# Patient Record
Sex: Female | Born: 1975 | Race: Black or African American | Hispanic: No | Marital: Single | Smoking: Former smoker
Health system: Southern US, Community
[De-identification: ages and names within clinical notes are randomized; demographics above are authoritative.]

## PROBLEM LIST (undated history)

## (undated) DIAGNOSIS — J45909 Unspecified asthma, uncomplicated: Secondary | ICD-10-CM

## (undated) HISTORY — PX: SINUS ENDO W/FUSION: SHX777

---

## 2016-09-18 ENCOUNTER — Emergency Department: Payer: Medicaid Other

## 2016-09-18 ENCOUNTER — Inpatient Hospital Stay
Admission: EM | Admit: 2016-09-18 | Discharge: 2016-09-19 | DRG: 202 | Disposition: A | Discharge status: Home / Self Care | Payer: Medicaid Other | Attending: Internal Medicine | Admitting: Internal Medicine

## 2016-09-18 ENCOUNTER — Encounter: Payer: Self-pay | Admitting: Emergency Medicine

## 2016-09-18 DIAGNOSIS — J4542 Moderate persistent asthma with status asthmaticus: Secondary | ICD-10-CM | POA: Diagnosis present

## 2016-09-18 DIAGNOSIS — D72829 Elevated white blood cell count, unspecified: Secondary | ICD-10-CM | POA: Diagnosis present

## 2016-09-18 DIAGNOSIS — J45909 Unspecified asthma, uncomplicated: Secondary | ICD-10-CM | POA: Diagnosis present

## 2016-09-18 DIAGNOSIS — J4541 Moderate persistent asthma with (acute) exacerbation: Secondary | ICD-10-CM | POA: Diagnosis present

## 2016-09-18 DIAGNOSIS — T380X5A Adverse effect of glucocorticoids and synthetic analogues, initial encounter: Secondary | ICD-10-CM | POA: Diagnosis present

## 2016-09-18 DIAGNOSIS — E876 Hypokalemia: Secondary | ICD-10-CM | POA: Diagnosis present

## 2016-09-18 DIAGNOSIS — J209 Acute bronchitis, unspecified: Secondary | ICD-10-CM | POA: Diagnosis present

## 2016-09-18 DIAGNOSIS — J9601 Acute respiratory failure with hypoxia: Secondary | ICD-10-CM | POA: Diagnosis present

## 2016-09-18 DIAGNOSIS — F17210 Nicotine dependence, cigarettes, uncomplicated: Secondary | ICD-10-CM | POA: Diagnosis present

## 2016-09-18 DIAGNOSIS — R0602 Shortness of breath: Secondary | ICD-10-CM | POA: Diagnosis present

## 2016-09-18 HISTORY — DX: Unspecified asthma, uncomplicated: J45.909

## 2016-09-18 LAB — COMPREHENSIVE METABOLIC PANEL
ALBUMIN: 4 g/dL (ref 3.5–5.0)
ALK PHOS: 79 U/L (ref 38–126)
ALT: 21 U/L (ref 14–54)
AST: 26 U/L (ref 15–41)
Anion gap: 5 (ref 5–15)
BILIRUBIN TOTAL: 0.4 mg/dL (ref 0.3–1.2)
BUN: 12 mg/dL (ref 6–20)
CALCIUM: 8.9 mg/dL (ref 8.9–10.3)
CO2: 26 mmol/L (ref 22–32)
Chloride: 105 mmol/L (ref 101–111)
Creatinine, Ser: 1.07 mg/dL — ABNORMAL HIGH (ref 0.44–1.00)
GFR calc Af Amer: >60 mL/min (ref >60)
Glucose, Bld: 104 mg/dL — ABNORMAL HIGH (ref 65–99)
POTASSIUM: 3.3 mmol/L — AB (ref 3.5–5.1)
Sodium: 136 mmol/L (ref 135–145)
TOTAL PROTEIN: 7.4 g/dL (ref 6.5–8.1)

## 2016-09-18 LAB — CBC WITH DIFFERENTIAL/PLATELET
BASOS ABS: 0.1 10*3/uL (ref 0–0.1)
BASOS PCT: 1 %
Eosinophils Absolute: 0 10*3/uL (ref 0–0.7)
Eosinophils Relative: 0 %
HEMATOCRIT: 39.1 % (ref 35.0–47.0)
HEMOGLOBIN: 13.3 g/dL (ref 12.0–16.0)
LYMPHS PCT: 13 %
Lymphs Abs: 2.4 10*3/uL (ref 1.0–3.6)
MCH: 28.9 pg (ref 26.0–34.0)
MCHC: 34 g/dL (ref 32.0–36.0)
MCV: 85.1 fL (ref 80.0–100.0)
MONO ABS: 0.9 10*3/uL (ref 0.2–0.9)
Monocytes Relative: 5 %
NEUTROS ABS: 15.6 10*3/uL — AB (ref 1.4–6.5)
NEUTROS PCT: 81 %
Platelets: 237 10*3/uL (ref 150–440)
RBC: 4.59 MIL/uL (ref 3.80–5.20)
RDW: 13.2 % (ref 11.5–14.5)
WBC: 19 10*3/uL — ABNORMAL HIGH (ref 3.6–11.0)

## 2016-09-18 MED ORDER — HYDROCODONE-ACETAMINOPHEN 5-325 MG PO TABS: 1.0000 | ORAL_TABLET | ORAL | Status: DC | PRN

## 2016-09-18 MED ORDER — MAGNESIUM SULFATE 2 GM/50ML IV SOLN
2.0000 g | Freq: Once | INTRAVENOUS | Status: AC
  Administered 2016-09-18: 2 g via INTRAVENOUS
  Filled 2016-09-18: qty 50

## 2016-09-18 MED ORDER — ONDANSETRON HCL 4 MG/2ML IJ SOLN: 4.0000 mg | Freq: Four times a day (QID) | INTRAMUSCULAR | Status: DC | PRN

## 2016-09-18 MED ORDER — DEXTROSE 5 % IV SOLN
500.0000 mg | INTRAVENOUS | Status: DC
  Administered 2016-09-18: 500 mg via INTRAVENOUS
  Filled 2016-09-18 (×2): qty 500

## 2016-09-18 MED ORDER — ALPRAZOLAM 0.25 MG PO TABS: 0.2500 mg | ORAL_TABLET | Freq: Two times a day (BID) | ORAL | Status: DC | PRN

## 2016-09-18 MED ORDER — ONDANSETRON HCL 4 MG PO TABS: 4.0000 mg | ORAL_TABLET | Freq: Four times a day (QID) | ORAL | Status: DC | PRN

## 2016-09-18 MED ORDER — POTASSIUM CHLORIDE CRYS ER 20 MEQ PO TBCR
40.0000 meq | EXTENDED_RELEASE_TABLET | Freq: Once | ORAL | Status: AC
  Administered 2016-09-18: 40 meq via ORAL
  Filled 2016-09-18: qty 2

## 2016-09-18 MED ORDER — SODIUM CHLORIDE 0.9 % IV SOLN
INTRAVENOUS | Status: DC
  Administered 2016-09-18: 12:00:00 via INTRAVENOUS
  Administered 2016-09-18: 1000 mL via INTRAVENOUS
  Administered 2016-09-19: 05:00:00 via INTRAVENOUS

## 2016-09-18 MED ORDER — ACETAMINOPHEN 325 MG PO TABS: 650.0000 mg | ORAL_TABLET | Freq: Four times a day (QID) | ORAL | Status: DC | PRN

## 2016-09-18 MED ORDER — TIOTROPIUM BROMIDE MONOHYDRATE 18 MCG IN CAPS
18.0000 ug | ORAL_CAPSULE | Freq: Every day | RESPIRATORY_TRACT | Status: DC
  Administered 2016-09-18 – 2016-09-19 (×2): 18 ug via RESPIRATORY_TRACT
  Filled 2016-09-18: qty 5

## 2016-09-18 MED ORDER — ENOXAPARIN SODIUM 40 MG/0.4ML ~~LOC~~ SOLN
40.0000 mg | SUBCUTANEOUS | Status: DC
  Filled 2016-09-18: qty 0.4

## 2016-09-18 MED ORDER — METHYLPREDNISOLONE SODIUM SUCC 40 MG IJ SOLR
40.0000 mg | Freq: Three times a day (TID) | INTRAMUSCULAR | Status: DC
  Administered 2016-09-18 – 2016-09-19 (×4): 40 mg via INTRAVENOUS
  Filled 2016-09-18 (×4): qty 1

## 2016-09-18 MED ORDER — IPRATROPIUM-ALBUTEROL 0.5-2.5 (3) MG/3ML IN SOLN
3.0000 mL | RESPIRATORY_TRACT | Status: DC
  Administered 2016-09-18 – 2016-09-19 (×7): 3 mL via RESPIRATORY_TRACT
  Filled 2016-09-18 (×7): qty 3

## 2016-09-18 MED ORDER — ALBUTEROL SULFATE (2.5 MG/3ML) 0.083% IN NEBU
10.0000 mg/h | INHALATION_SOLUTION | RESPIRATORY_TRACT | Status: DC
  Administered 2016-09-18: 10 mg/h via RESPIRATORY_TRACT

## 2016-09-18 MED ORDER — ACETAMINOPHEN 650 MG RE SUPP: 650.0000 mg | Freq: Four times a day (QID) | RECTAL | Status: DC | PRN

## 2016-09-18 MED ORDER — SENNOSIDES-DOCUSATE SODIUM 8.6-50 MG PO TABS: 1.0000 | ORAL_TABLET | Freq: Every evening | ORAL | Status: DC | PRN

## 2016-09-18 MED ORDER — ALBUTEROL SULFATE (2.5 MG/3ML) 0.083% IN NEBU
INHALATION_SOLUTION | RESPIRATORY_TRACT | Status: AC
  Administered 2016-09-18: 10 mg/h via RESPIRATORY_TRACT
  Filled 2016-09-18: qty 12

## 2016-09-18 MED ORDER — NICOTINE 21 MG/24HR TD PT24
21.0000 mg | MEDICATED_PATCH | Freq: Every day | TRANSDERMAL | Status: DC
  Administered 2016-09-19: 21 mg via TRANSDERMAL
  Filled 2016-09-18: qty 1

## 2016-09-18 MED ORDER — SODIUM CHLORIDE 0.9% FLUSH
3.0000 mL | Freq: Two times a day (BID) | INTRAVENOUS | Status: DC
  Administered 2016-09-18: 3 mL via INTRAVENOUS

## 2016-09-18 NOTE — ED Notes (Signed)
This RN conferred with respiratory about administering continuous neb, per Respiratory place full dose and run at approx 6L/min O2.

## 2016-09-18 NOTE — H&P (Signed)
[employer] - [place] at [facility]   PATIENT NAME: Taylor Mcpherson    MR#:  [id number]  DATE OF BIRTH:  December 29, 1975  DATE OF ADMISSION:  09/18/2016  PRIMARY CARE PHYSICIAN: No primary care provider on file.   REQUESTING/REFERRING PHYSICIAN: Dr. Huel Cote  CHIEF COMPLAINT:   Shortness of breath HISTORY OF PRESENT ILLNESS:  Taylor Mcpherson  is a 40 y.o. female with a known history of Asthma and tobacco dependence who presents with above complaint. Patient reports over the past 3 days she's had increasing shortness of breath, cough and wheezing. In the emergency room she received DuoNeb's and steroids however has not had much improvement.  PAST MEDICAL HISTORY:   Past Medical History:  Diagnosis Date  . Asthma     PAST SURGICAL HISTORY:  None  SOCIAL HISTORY:   Social History  Substance Use Topics  . Smoking status: Current Some Day Smoker    Packs/day: 1.00    Types: Cigarettes  . Smokeless tobacco: Never Used  . Alcohol use No    FAMILY HISTORY:  No history of asthma  DRUG ALLERGIES:  None  REVIEW OF SYSTEMS:   Review of Systems  Constitutional: Negative for chills, fever and malaise/fatigue.  HENT: Negative.  Negative for ear discharge, ear pain, hearing loss, nosebleeds and sore throat.   Eyes: Negative.  Negative for blurred vision and pain.  Respiratory: Positive for cough, sputum production, shortness of breath and wheezing. Negative for hemoptysis.   Cardiovascular: Negative.  Negative for chest pain, palpitations and leg swelling.  Gastrointestinal: Negative.  Negative for abdominal pain, blood in stool, diarrhea, nausea and vomiting.  Genitourinary: Negative.  Negative for dysuria.  Musculoskeletal: Negative.  Negative for back pain.  Skin: Negative.   Neurological: Positive for weakness. Negative for dizziness, tremors, speech change, focal weakness, seizures and headaches.  Endo/Heme/Allergies: Negative.  Does not bruise/bleed easily.   Psychiatric/Behavioral: Negative.  Negative for depression, hallucinations and suicidal ideas.    MEDICATIONS AT HOME:   Prior to Admission medications   Not on File      VITAL SIGNS:  Blood pressure (!) 145/86, pulse (!) 101, temperature 98.1 F (36.7 C), temperature source Oral, resp. rate (!) 24, height 5\' 3"  (1.6 m), weight 93.9 kg (207 lb), last menstrual period 08/31/2016, SpO2 100 %.  PHYSICAL EXAMINATION:   Physical Exam  Constitutional: She is oriented to person, place, and time and well-developed, well-nourished, and in no distress. No distress.  HENT:  Head: Normocephalic.  Eyes: No scleral icterus.  Neck: Normal range of motion. Neck supple. No JVD present. No tracheal deviation present.  Cardiovascular: Normal rate, regular rhythm and normal heart sounds.  Exam reveals no gallop and no friction rub.   No murmur heard. Pulmonary/Chest: Effort normal. No respiratory distress. She has wheezes. She has no rales. She exhibits no tenderness.  Abdominal: Soft. Bowel sounds are normal. She exhibits no distension and no mass. There is no tenderness. There is no rebound and no guarding.  Musculoskeletal: Normal range of motion. She exhibits no edema.  Neurological: She is alert and oriented to person, place, and time.  Skin: Skin is warm. No rash noted. No erythema.  Psychiatric: Affect and judgment normal.      LABORATORY PANEL:   CBC  Recent Labs Lab 09/18/16 0834  WBC 19.0*  HGB 13.3  HCT 39.1  PLT 237   ------------------------------------------------------------------------------------------------------------------  Chemistries   Recent Labs Lab 09/18/16 0834  NA 136  K 3.3*  CL 105  CO2 26  GLUCOSE 104*  BUN 12  CREATININE 1.07*  CALCIUM 8.9  AST 26  ALT 21  ALKPHOS 79  BILITOT 0.4   ------------------------------------------------------------------------------------------------------------------  Cardiac Enzymes No results for input(s):  TROPONINI in the last 168 hours. ------------------------------------------------------------------------------------------------------------------  RADIOLOGY:  Dg Chest Port 1 View  Result Date: 09/18/2016 CLINICAL DATA:  Central chest pain for 1 day, some shortness of breath, smoking history EXAM: PORTABLE CHEST 1 VIEW COMPARISON:  None. FINDINGS: No active infiltrate or effusion is seen. Mediastinal and hilar contours are unremarkable. The heart is within normal limits in size. No bony abnormality is seen. IMPRESSION: No active disease. Electronically Signed   By: Dwyane DeePaul  Barry M.D.   On: 09/18/2016 08:50    EKG:  Sinus tachycardia no ST elevation or depression  IMPRESSION AND PLAN:   [age] year female with a history of asthma who presents with asthma exacerbation.  1. Acute hypoxic respiratory failure in the setting asthma exacerbation: Patient will be treated for asthma as outlined below. Wean oxygen as tolerated.  2. Acute asthma exacerbation: Patient will start on IV steroids, DuoNeb's and I will also initiate long acting beta agonist/steroid inhaler. She will need a rescue inhaler at discharge along with an long acting beta agonist/steroid combination  3. Tobacco dependence: Patient is highly encouraged stop smoking. Patient counseled 3 minutes. Patient does not want a nicotine patch at this time. I did mention if she needs it during hospitalization then it will be available.  4. Leukocytosis: No evidence of pneumonia on chest x-ray. Will repeat CBC in a.m.  5. Hypokalemia: This was repleted. She was given magnesium in the emergency room for acute asthma exacerbation.   All the records are reviewed and case discussed with ED provider. Management plans discussed with the patient and she in agreement  CODE STATUS: full  TOTAL TIME TAKING CARE OF THIS PATIENT: 50 minutes.    Brinlynn Gorton M.D on 09/18/2016 at 11:46 AM  Between 7am to 6pm - Pager - [phone]  After 6pm go to  [email or web address] - Social research officer, governmentpassword [email or web address]  [facility] [place] [facility]  Office  [phone]  CC: Primary care physician; No primary care provider on file.

## 2016-09-18 NOTE — ED Notes (Signed)
Report called to Josh, RN 

## 2016-09-18 NOTE — ED Notes (Signed)
Pt continues her continuous nebulizer at this time. Pt noted to be sleeping but is arousable with mild stimulation at this time. No change in patient condition.

## 2016-09-18 NOTE — ED Triage Notes (Signed)
Pt presents from home via [facility] with c/o difficulty breathing and chest pain. Pt states chest pain started yesterday, c/o L sided chest pain that is sharp/shooting in nature. Pt also c/o SHOB, states hx of asthma and has been taking rescue inhaler at home with no relief. Per EMS 125mg  of Solumedrol given en route as well as 2 duonebs. EMS states pt has been coughing up yellow sputum.

## 2016-09-18 NOTE — ED Notes (Signed)
MD notified, this RN to bedside, pt states that she is coughing up blood, specs of blood noted to be in patient sputum.

## 2016-09-18 NOTE — ED Notes (Signed)
Pt placed on 3L O2 via [place] at this time due to O2 sat 82% on room air. MD notified at this time, MD to bedside at this time.

## 2016-09-18 NOTE — ED Provider Notes (Signed)
Time Seen: Approximately 0834 I have reviewed the triage notes  Chief Complaint: Chest Pain and Shortness of Breath   History of Present Illness: Taylor Mcpherson is a 40 y.o. female who presents with a history of asthma. Patient states that she's had a productive cough and increasing shortness of breath over the last 2-3 days. She denies any hemoptysis. She denies any significant pulmonary emboli risk factors. Patient states she does have a history of asthma as been admitted before in the past approximately year ago at [facility]. The patient states she's had some chills no obvious fever at home. Patient was transported here by EMS after she used one nebulizer at home without relief and received 2 in transport. She's had IV Solu-Medrol 125 mg IV.   No past medical history on file.  There are no active problems to display for this patient.   No past surgical history on file.  No past surgical history on file.    Allergies:  Review of patient's allergies indicates not on file.  Family History: No family history on file.  Social History: Social History  Substance Use Topics  . Smoking status: Not on file  . Smokeless tobacco: Not on file  . Alcohol use Not on file     Review of Systems:   10 point review of systems was performed and was otherwise negative:  Constitutional: No Objective fever Eyes: No visual disturbances ENT: No sore throat, ear pain Cardiac: No chest pain Respiratory: Increasing shortness of breath. Abdomen: No abdominal pain, no vomiting, No diarrhea Endocrine: No weight loss, No night sweats Extremities: No peripheral edema, cyanosis Skin: No rashes, easy bruising Neurologic: No focal weakness, trouble with speech or swollowing Urologic: No dysuria, Hematuria, or urinary frequency *  Physical Exam:  ED Triage Vitals [09/18/16 0833]  Enc Vitals Group     BP (!) 158/95     Pulse Rate (!) 103     Resp (!) 24     Temp 98.1 F (36.7  C)     Temp Source Axillary     SpO2 (!) 82 %     Weight 207 lb (93.9 kg)     Height 5\' 3"  (1.6 m)     Head Circumference      Peak Flow      Pain Score 9     Pain Loc      Pain Edu?      Excl. in GC?     General: Awake , Alert , and Oriented times 3; GCS 15 Patient has some mild upper respiratory retractions and speaks in interrupted sentences Head: Normal cephalic , atraumatic Eyes: Pupils equal , round, reactive to light Nose/Throat: No nasal drainage, patent upper airway without erythema or exudate.  Neck: Supple, Full range of motion, No anterior adenopathy or palpable thyroid masses Lungs: Diffuse wheezing heard bilaterallysymmetric except some mild rhonchi in the left lower lung fields. Heart: Tachycardic, regular rhythm without murmurs , gallops , or rubs Abdomen: Soft, non tender without rebound, guarding , or rigidity; bowel sounds positive and symmetric in all 4 quadrants. No organomegaly .        Extremities: 2 plus symmetric pulses. No edema, clubbing or cyanosis. Negative Homans sign Neurologic: normal ambulation, Motor symmetric without deficits, sensory intact Skin: warm, dry, no rashes   Labs:   All laboratory work was reviewed including any pertinent negatives or positives listed below:  Labs Reviewed  CBC WITH DIFFERENTIAL/PLATELET  COMPREHENSIVE METABOLIC PANEL  EKG:  ED ECG REPORT I, Jennye MoccasinBrian S Jeorgia Helming, the attending physician, personally viewed and interpreted this ECG.  Date: 09/18/2016 EKG Time: 0825 Rate: 104 Rhythm: Sinus tachycardia QRS Axis: normal Intervals: normal ST/T Wave abnormalities: normal Conduction Disturbances: none Narrative Interpretation: unremarkable no acute ischemic changes  Radiology: * "Dg Chest Port 1 View  Result Date: 09/18/2016 CLINICAL DATA:  Central chest pain for 1 day, some shortness of breath, smoking history EXAM: PORTABLE CHEST 1 VIEW COMPARISON:  None. FINDINGS: No active infiltrate or effusion is seen.  Mediastinal and hilar contours are unremarkable. The heart is within normal limits in size. No bony abnormality is seen. IMPRESSION: No active disease. Electronically Signed   By: Dwyane DeePaul  Barry M.D.   On: 09/18/2016 08:50  " I personally reviewed the radiologic studies    ED Course:  Patient's stay here showed some mild symptomatic improvement though she arrives significantly hypoxic with a history of previous admissions for her asthma. Patient was given IV Solu-Medrol prior to arrival and was placed on a continuous nebulization here in emergency department. She has no history of cardiovascular disease. On a 6 L nasal cannula and were able to decrease it somewhat while she was receiving her treatment etc.  Clinical Course     Assessment: Acute status asthmaticus    Plan:  Inpatient            Jennye MoccasinBrian S Izmael Duross, MD 09/18/16 [phone]

## 2016-09-19 LAB — CBC
HEMATOCRIT: 40.3 % (ref 35.0–47.0)
Hemoglobin: 13 g/dL (ref 12.0–16.0)
MCH: 27.9 pg (ref 26.0–34.0)
MCHC: 32.2 g/dL (ref 32.0–36.0)
MCV: 86.5 fL (ref 80.0–100.0)
Platelets: 248 10*3/uL (ref 150–440)
RBC: 4.66 MIL/uL (ref 3.80–5.20)
RDW: 13.5 % (ref 11.5–14.5)
WBC: 24.3 10*3/uL — ABNORMAL HIGH (ref 3.6–11.0)

## 2016-09-19 LAB — BASIC METABOLIC PANEL
ANION GAP: 7 (ref 5–15)
BUN: 11 mg/dL (ref 6–20)
CHLORIDE: 109 mmol/L (ref 101–111)
CO2: 23 mmol/L (ref 22–32)
Calcium: 8.9 mg/dL (ref 8.9–10.3)
Creatinine, Ser: 0.93 mg/dL (ref 0.44–1.00)
Glucose, Bld: 150 mg/dL — ABNORMAL HIGH (ref 65–99)
POTASSIUM: 4 mmol/L (ref 3.5–5.1)
SODIUM: 139 mmol/L (ref 135–145)

## 2016-09-19 MED ORDER — TIOTROPIUM BROMIDE MONOHYDRATE 18 MCG IN CAPS: 18.0000 ug | ORAL_CAPSULE | Freq: Every day | RESPIRATORY_TRACT | 12 refills | Status: DC

## 2016-09-19 MED ORDER — PREDNISONE 10 MG PO TABS: 10.0000 mg | ORAL_TABLET | Freq: Every day | ORAL | 0 refills | Status: DC

## 2016-09-19 MED ORDER — AZITHROMYCIN 250 MG PO TABS: 250.0000 mg | ORAL_TABLET | Freq: Every day | ORAL | 0 refills | Status: DC

## 2016-09-19 MED ORDER — ALBUTEROL SULFATE HFA 108 (90 BASE) MCG/ACT IN AERS: 2.0000 | INHALATION_SPRAY | Freq: Four times a day (QID) | RESPIRATORY_TRACT | 2 refills | Status: DC | PRN

## 2016-09-19 MED ORDER — NICOTINE 21 MG/24HR TD PT24: 21.0000 mg | MEDICATED_PATCH | Freq: Every day | TRANSDERMAL | 0 refills | Status: DC

## 2016-09-19 MED ORDER — NICOTINE 21 MG/24HR TD PT24: 21.0000 mg | MEDICATED_PATCH | Freq: Every day | TRANSDERMAL | 0 refills | Status: AC

## 2016-09-19 MED ORDER — TIOTROPIUM BROMIDE MONOHYDRATE 18 MCG IN CAPS: 18.0000 ug | ORAL_CAPSULE | Freq: Every day | RESPIRATORY_TRACT | 12 refills | Status: AC

## 2016-09-19 NOTE — Progress Notes (Signed)
MD ordered patient to be discharged home.  Discharge instructions were reviewed with the patient and she voiced understanding.  Follow-up appointment was made.  Prescriptions given to the patient.  Patient given her Spiriva inhaler to take home.  IV was removed with catheter intact.  All patients questions were answered.  Patient leaving via wheelchair escorted by auxillary.

## 2016-09-19 NOTE — Discharge Summary (Signed)
[employer] - [place] at [facility]   PATIENT NAME: Taylor Mcpherson    MR#:  [id number]  DATE OF BIRTH:  03-Oct-1976  DATE OF ADMISSION:  09/18/2016 ADMITTING PHYSICIAN: Adrian Saran, MD  DATE OF DISCHARGE: 09/19/2016   PRIMARY CARE PHYSICIAN: No primary care provider on file.    ADMISSION DIAGNOSIS:  Moderate persistent asthma with exacerbation [J45.41]  DISCHARGE DIAGNOSIS:  Active Problems:   Asthma   SECONDARY DIAGNOSIS:   Past Medical History:  Diagnosis Date  . Asthma     HOSPITAL COURSE:   40 year female with a history of asthma who presents with asthma exacerbation.  1. Acute hypoxic respiratory failure in the setting asthma exacerbation: Patient was treated for asthma as outlined below. She was weaned off of OXYGEN.  2. Acute asthma exacerbation: Patient Was initiated on IV steroids and azithromycin. She responded well to IV steroids. She will be transitioned to oral steroids at discharge and will continue azithromycin for acute bronchitis..   3. Tobacco dependence: She will be discharged with a nicotine patch. She is encouraged to smoking.   4. Leukocytosis:White blood cell elevation today is due to steroids. She had no fevers and no evidence of pneumonia.  5. Hypokalemia: This was repleted.   DISCHARGE CONDITIONS AND DIET:   Stable Regular diet  CONSULTS OBTAINED:    DRUG ALLERGIES:  Not on File  DISCHARGE MEDICATIONS:   Current Discharge Medication List    START taking these medications   Details  albuterol (PROVENTIL HFA;VENTOLIN HFA) 108 (90 Base) MCG/ACT inhaler Inhale 2 puffs into the lungs every 6 (six) hours as needed for wheezing or shortness of breath. Qty: 1 Inhaler, Refills: 2    azithromycin (ZITHROMAX) 250 MG tablet Take 1 tablet (250 mg total) by mouth daily. Qty: 6 each, Refills: 0    nicotine (NICODERM CQ - DOSED IN MG/24 HOURS) 21 mg/24hr patch Place 1 patch (21 mg total) onto the skin daily. Qty: 28 patch,  Refills: 0    predniSONE (DELTASONE) 10 MG tablet Take 1 tablet (10 mg total) by mouth daily with breakfast. 60 mg PO (ORAL) x 2 days 50 mg PO (ORAL)  x 2 days 40 mg PO (ORAL)  x 2 days 30 mg PO  (ORAL)  x 2 days 20 mg PO  (ORAL) x 2 days 10 mg PO  (ORAL) x 2 days then stop Qty: 42 tablet, Refills: 0    tiotropium (SPIRIVA) 18 MCG inhalation capsule Place 1 capsule (18 mcg total) into inhaler and inhale daily. Qty: 30 capsule, Refills: 12              Today   CHIEF COMPLAINT:  Patient is doing well wants to go home less SOB and whezing today    VITAL SIGNS:  Blood pressure 139/67, pulse 95, temperature 98.5 F (36.9 C), temperature source Oral, resp. rate 20, height 5\' 3"  (1.6 m), weight 93.9 kg (207 lb), last menstrual period 08/31/2016, SpO2 93 %.   REVIEW OF SYSTEMS:  Review of Systems  Constitutional: Negative.  Negative for chills, fever and malaise/fatigue.  HENT: Negative.  Negative for ear discharge, ear pain, hearing loss, nosebleeds and sore throat.   Eyes: Negative.  Negative for blurred vision and pain.  Respiratory: Positive for cough. Negative for hemoptysis, shortness of breath and wheezing.   Cardiovascular: Negative.  Negative for chest pain, palpitations and leg swelling.  Gastrointestinal: Negative.  Negative for abdominal pain, blood in stool, diarrhea, nausea and vomiting.  Genitourinary: Negative.  Negative for dysuria.  Musculoskeletal: Negative.  Negative for back pain.  Skin: Negative.   Neurological: Negative for dizziness, tremors, speech change, focal weakness, seizures and headaches.  Endo/Heme/Allergies: Negative.  Does not bruise/bleed easily.  Psychiatric/Behavioral: Negative.  Negative for depression, hallucinations and suicidal ideas.     PHYSICAL EXAMINATION:  GENERAL:  40 y.o.-year-old patient lying in the bed with no acute distress.  NECK:  Supple, no jugular venous distention. No thyroid enlargement, no tenderness.  LUNGS:  Normal breath sounds bilaterally, + wheezing, NO rales,rhonchi  No use of accessory muscles of respiration.  CARDIOVASCULAR: S1, S2 normal. No murmurs, rubs, or gallops.  ABDOMEN: Soft, non-tender, non-distended. Bowel sounds present. No organomegaly or mass.  EXTREMITIES: No pedal edema, cyanosis, or clubbing.  PSYCHIATRIC: The patient is alert and oriented x 3.  SKIN: No obvious rash, lesion, or ulcer.   DATA REVIEW:   CBC  Recent Labs Lab 09/19/16 0546  WBC 24.3*  HGB 13.0  HCT 40.3  PLT 248    Chemistries   Recent Labs Lab 09/18/16 0834 09/19/16 0546  NA 136 139  K 3.3* 4.0  CL 105 109  CO2 26 23  GLUCOSE 104* 150*  BUN 12 11  CREATININE 1.07* 0.93  CALCIUM 8.9 8.9  AST 26  --   ALT 21  --   ALKPHOS 79  --   BILITOT 0.4  --     Cardiac Enzymes No results for input(s): TROPONINI in the last 168 hours.  Microbiology Results  @MICRORSLT48 @  RADIOLOGY:  Dg Chest Port 1 View  Result Date: 09/18/2016 CLINICAL DATA:  Central chest pain for 1 day, some shortness of breath, smoking history EXAM: PORTABLE CHEST 1 VIEW COMPARISON:  None. FINDINGS: No active infiltrate or effusion is seen. Mediastinal and hilar contours are unremarkable. The heart is within normal limits in size. No bony abnormality is seen. IMPRESSION: No active disease. Electronically Signed   By: Dwyane DeePaul  Barry M.D.   On: 09/18/2016 08:50      Management plans discussed with the patient and she is in agreement. Stable for discharge home  Patient should follow up with pcp  CODE STATUS:     Code Status Orders        Start     Ordered   09/18/16 1054  Full code  Continuous     09/18/16 1053    Code Status History    Date Active Date Inactive Code Status Order ID Comments User Context   This patient has a current code status but no historical code status.      TOTAL TIME TAKING CARE OF THIS PATIENT: 35 minutes.    Note: This dictation was prepared with Dragon dictation along with  smaller phrase technology. Any transcriptional errors that result from this process are unintentional.  Anisa Leanos M.D on 09/19/2016 at 11:39 AM  Between 7am to 6pm - Pager - [phone] After 6pm go to [email or web address] - Social research officer, governmentpassword EPAS [email or web address]  [facility] [place] [facility]  Office  [phone]  CC: Primary care physician; No primary care provider on file.

## 2016-09-19 NOTE — Care Management (Signed)
Patient listed as self pay patient.  Upon assessment patient states that she has active [place] medicaid, and uses it to obtain her medications.  Patient states that her medications are $3, and she denies issues obtaining those.  Should the patient run issues to obtaining her medication, and in the event that her Medicaid were to be deemed inactive I have provided her an application to medication management.  Per Medication management they would be able to fill her new prescriptions except for the nicotine patch and sprivia.  RNCM signing off.

## 2016-09-19 NOTE — Progress Notes (Signed)
Patient was taken off her oxygen this morning and her sats at rest were 92%.  She then got up and ambulated 1 lap around the nurses station and her sats stayed around 93%.  Patient was a little short of breath but did well and stated "she was not nearly as short of breath as she was when she came in."

## 2017-01-03 ENCOUNTER — Emergency Department: Payer: Medicaid Other

## 2017-01-03 ENCOUNTER — Emergency Department
Admission: EM | Admit: 2017-01-03 | Discharge: 2017-01-03 | Disposition: A | Discharge status: Home / Self Care | Payer: Medicaid Other | Attending: Emergency Medicine | Admitting: Emergency Medicine

## 2017-01-03 ENCOUNTER — Encounter: Payer: Self-pay | Admitting: Emergency Medicine

## 2017-01-03 DIAGNOSIS — R0602 Shortness of breath: Secondary | ICD-10-CM | POA: Diagnosis present

## 2017-01-03 DIAGNOSIS — Z87891 Personal history of nicotine dependence: Secondary | ICD-10-CM | POA: Insufficient documentation

## 2017-01-03 DIAGNOSIS — J45901 Unspecified asthma with (acute) exacerbation: Secondary | ICD-10-CM | POA: Insufficient documentation

## 2017-01-03 DIAGNOSIS — J101 Influenza due to other identified influenza virus with other respiratory manifestations: Secondary | ICD-10-CM

## 2017-01-03 DIAGNOSIS — J09X2 Influenza due to identified novel influenza A virus with other respiratory manifestations: Secondary | ICD-10-CM | POA: Diagnosis not present

## 2017-01-03 LAB — INFLUENZA PANEL BY PCR (TYPE A & B)
Influenza A By PCR: POSITIVE — AB
Influenza B By PCR: NEGATIVE

## 2017-01-03 MED ORDER — ALBUTEROL SULFATE (2.5 MG/3ML) 0.083% IN NEBU
5.0000 mg | INHALATION_SOLUTION | Freq: Once | RESPIRATORY_TRACT | Status: AC
  Administered 2017-01-03: 5 mg via RESPIRATORY_TRACT
  Filled 2017-01-03: qty 6

## 2017-01-03 MED ORDER — PREDNISONE 20 MG PO TABS
60.0000 mg | ORAL_TABLET | Freq: Once | ORAL | Status: AC
  Administered 2017-01-03: 60 mg via ORAL
  Filled 2017-01-03: qty 3

## 2017-01-03 MED ORDER — IPRATROPIUM-ALBUTEROL 0.5-2.5 (3) MG/3ML IN SOLN
3.0000 mL | Freq: Once | RESPIRATORY_TRACT | Status: AC
  Administered 2017-01-03: 3 mL via RESPIRATORY_TRACT
  Filled 2017-01-03: qty 3

## 2017-01-03 MED ORDER — ALBUTEROL SULFATE HFA 108 (90 BASE) MCG/ACT IN AERS: 2.0000 | INHALATION_SPRAY | Freq: Four times a day (QID) | RESPIRATORY_TRACT | 2 refills | Status: AC | PRN

## 2017-01-03 MED ORDER — PREDNISONE 20 MG PO TABS: 60.0000 mg | ORAL_TABLET | Freq: Every day | ORAL | 0 refills | Status: AC

## 2017-01-03 MED ORDER — OSELTAMIVIR PHOSPHATE 75 MG PO CAPS
75.0000 mg | ORAL_CAPSULE | Freq: Once | ORAL | Status: AC
  Administered 2017-01-03: 75 mg via ORAL
  Filled 2017-01-03: qty 1

## 2017-01-03 MED ORDER — OSELTAMIVIR PHOSPHATE 75 MG PO CAPS: 75.0000 mg | ORAL_CAPSULE | Freq: Two times a day (BID) | ORAL | 0 refills | Status: AC

## 2017-01-03 NOTE — ED Notes (Signed)
This RN to bedside at this time. Pt resting in bed with NAD noted at this time. Pt awoke to adminster meds to her granddaughter who is also a patient at this time. Upon this RN departure from room, lights dimmed for patient comfort.

## 2017-01-03 NOTE — ED Notes (Signed)
NAD noted at time of D/C. Pt denies questions or concerns. Pt ambulatory to the lobby at this time.  

## 2017-01-03 NOTE — ED Notes (Signed)
This RN to bedside, brought patient sprite per request. This RN instructed patient that she could have a sprite after she finished her breathing tx. Pt states understanding, visualized in NAD at this time.

## 2017-01-03 NOTE — ED Triage Notes (Signed)
Pt states [facility] and given Prednisone and Amoxicillin d/t a sinus infection. Pt states that her asthma is still bothering her at this time.  Pt is ambulatory to triage with NAD noted at this time.

## 2017-01-03 NOTE — ED Provider Notes (Signed)
[facility] Emergency Department Provider Note  ____________________________________________  Time seen: Approximately 8:14 AM  I have reviewed the triage vital signs and the nursing notes.   HISTORY  Chief Complaint Wheezing   HPI Quetzalli Clos is a 41 y.o. female history of asthma who presents for evaluation of wheezing and shortness of breath. Patient reports that she has been sick on and off for the last month. Was seen at [facility] 4 days ago and was started on prednisone and amoxicillin. She finished her prednisone yesterday and is still on amoxicillin. She was diagnosed and asthma exacerbation. Patient continues to use her inhalers at home however reports that her cough is very severe and is keeping her up at night. No fever or chills, no nausea or diarrhea. Patient has had a few episodes of posttussive emesis. She reports that she stop smoking 2 months ago. She has not receive her flu shot this year. Patient denies chest pain, abdominal pain. She has had SOb and wheezing. Cough productive of green sputum.  Past Medical History:  Diagnosis Date  . Asthma     Patient Active Problem List   Diagnosis Date Noted  . Asthma 09/18/2016    Past Surgical History:  Procedure Laterality Date  . CESAREAN SECTION    . SINUS ENDO W/FUSION      Prior to Admission medications   Medication Sig Start Date End Date Taking? Authorizing Provider  amoxicillin-clavulanate (AUGMENTIN) 875-125 MG tablet Take 1 tablet by mouth 2 (two) times daily. 12/25/16 01/03/17 Yes Historical Provider, MD  albuterol (PROVENTIL HFA;VENTOLIN HFA) 108 (90 Base) MCG/ACT inhaler Inhale 2 puffs into the lungs every 6 (six) hours as needed for wheezing or shortness of breath. 01/03/17   Nita Sickle, MD  nicotine (NICODERM CQ - DOSED IN MG/24 HOURS) 21 mg/24hr patch Place 1 patch (21 mg total) onto the skin daily. Patient not taking: Reported on 01/03/2017 09/19/16   Adrian Saran, MD    oseltamivir (TAMIFLU) 75 MG capsule Take 1 capsule (75 mg total) by mouth 2 (two) times daily. 01/03/17 01/08/17  Nita Sickle, MD  predniSONE (DELTASONE) 20 MG tablet Take 3 tablets (60 mg total) by mouth daily. 01/03/17 01/07/17  Nita Sickle, MD  tiotropium (SPIRIVA) 18 MCG inhalation capsule Place 1 capsule (18 mcg total) into inhaler and inhale daily. Patient not taking: Reported on 01/03/2017 09/19/16   Adrian Saran, MD    Allergies Vicodin [hydrocodone-acetaminophen]  No family history on file.  Social History Social History  Substance Use Topics  . Smoking status: Former Smoker    Packs/day: 1.00    Types: Cigarettes    Quit date: 07/03/2016  . Smokeless tobacco: Never Used  . Alcohol use No    Review of Systems  Constitutional: Negative for fever. Eyes: Negative for visual changes. ENT: Negative for sore throat. Neck: No neck pain  Cardiovascular: Negative for chest pain. Respiratory: + shortness of breath, cough, wheezing Gastrointestinal: Negative for abdominal pain, vomiting or diarrhea. Genitourinary: Negative for dysuria. Musculoskeletal: Negative for back pain. Skin: Negative for rash. Neurological: Negative for headaches, weakness or numbness. Psych: No SI or HI  ____________________________________________   PHYSICAL EXAM:  VITAL SIGNS: ED Triage Vitals  Enc Vitals Group     BP 01/03/17 0618 125/88     Pulse Rate 01/03/17 0618 72     Resp --      Temp 01/03/17 0618 98.1 F (36.7 C)     Temp Source 01/03/17 0618 Oral  SpO2 01/03/17 0618 97 %     Weight 01/03/17 0603 235 lb (106.6 kg)     Height 01/03/17 0603 5\' 3"  (1.6 m)     Head Circumference --      Peak Flow --      Pain Score 01/03/17 0605 7     Pain Loc --      Pain Edu? --      Excl. in GC? --     Constitutional: Alert and oriented. Well appearing and in no apparent distress. HEENT:      Head: Normocephalic and atraumatic.         Eyes: Conjunctivae are normal. Sclera is  non-icteric. EOMI. PERRL      Mouth/Throat: Mucous membranes are moist.       Neck: Supple with no signs of meningismus. Cardiovascular: Regular rate and rhythm. No murmurs, gallops, or rubs. 2+ symmetrical distal pulses are present in all extremities. No JVD. Respiratory: Normal respiratory effort. Lungs are clear to auscultation bilaterally with diffuse expiratory wheezes, no crackles. Gastrointestinal: Soft, non tender, and non distended with positive bowel sounds. No rebound or guarding. Genitourinary: No CVA tenderness. Musculoskeletal: Nontender with normal range of motion in all extremities. No edema, cyanosis, or erythema of extremities. Neurologic: Normal speech and language. Face is symmetric. Moving all extremities. No gross focal neurologic deficits are appreciated. Skin: Skin is warm, dry and intact. No rash noted. Psychiatric: Mood and affect are normal. Speech and behavior are normal.  ____________________________________________   LABS (all labs ordered are listed, but only abnormal results are displayed)  Labs Reviewed  INFLUENZA PANEL BY PCR (TYPE A & B) - Abnormal; Notable for the following:       Result Value   Influenza A By PCR POSITIVE (*)    All other components within normal limits   ____________________________________________  EKG  ED ECG REPORT I, Nita Sicklearolina Karne Ozga, the attending physician, personally viewed and interpreted this ECG.  Normal sinus rhythm, rate of 68, normal intervals, normal axis, no ST elevations or depressions ____________________________________________  RADIOLOGY  CXR:  Mild central bronchial thickening may be bronchitis or asthma. ____________________________________________   PROCEDURES  Procedure(s) performed: None Procedures Critical Care performed: yes  CRITICAL CARE Performed by: Nita Sicklearolina Tinisha Etzkorn  ?  Total critical care time: 35 min  Critical care time was exclusive of separately billable procedures and  treating other patients.  Critical care was necessary to treat or prevent imminent or life-threatening deterioration.  Critical care was time spent personally by me on the following activities: development of treatment plan with patient and/or surrogate as well as nursing, discussions with consultants, evaluation of patient's response to treatment, examination of patient, obtaining history from patient or surrogate, ordering and performing treatments and interventions, ordering and review of laboratory studies, ordering and review of radiographic studies, pulse oximetry and re-evaluation of patient's condition.  ____________________________________________   INITIAL IMPRESSION / ASSESSMENT AND PLAN / ED COURSE  41 y.o. female history of asthma who presents for evaluation of wheezing and shortness of breath in the setting of productive cough. This has just finished a course of prednisone and is currently on amoxicillin was started 4 days ago. She is well appearing and in no distress, her vital signs are within normal limits, she has normal work of breathing with diffuse expiratory wheezes, no crackles. Chest x-ray with no evidence of pneumonia. We'll check for flu. We'll give duoneb and prednisone.     _________________________ 12:56 PM on 01/03/2017 -----------------------------------------  Patient received  3 duoneb treatments and was still wheezing. She then received 5 mg of albuterol and is currently moving great air with no wheezing. She is Flu A positive and was started on Tamiflu. Chest x-ray with no evidence of pneumonia. Patient be discharged home on Tamiflu, prednisone 60 mg 5 days, and albuterol. Recommended follow-up with [facility] care doctor on Monday area discussed return precautions with patient.  Pertinent labs & imaging results that were available during my care of the patient were reviewed by me and considered in my medical decision making (see chart for  details).    ____________________________________________   FINAL CLINICAL IMPRESSION(S) / ED DIAGNOSES  Final diagnoses:  Influenza A  Exacerbation of asthma, unspecified asthma severity, unspecified whether persistent      NEW MEDICATIONS STARTED DURING THIS VISIT:  New Prescriptions   ALBUTEROL (PROVENTIL HFA;VENTOLIN HFA) 108 (90 BASE) MCG/ACT INHALER    Inhale 2 puffs into the lungs every 6 (six) hours as needed for wheezing or shortness of breath.   OSELTAMIVIR (TAMIFLU) 75 MG CAPSULE    Take 1 capsule (75 mg total) by mouth 2 (two) times daily.   PREDNISONE (DELTASONE) 20 MG TABLET    Take 3 tablets (60 mg total) by mouth daily.     Note:  This document was prepared using Dragon voice recognition software and may include unintentional dictation errors.    Nita Sickle, MD 01/03/17 1257

## 2017-01-03 NOTE — ED Notes (Signed)
Pt awakened by this RN to check vitals. Pt is noted to be alert and oriented at this time. Pt denies any needs. Will continue to monitor for further patient needs.

## 2018-05-10 IMAGING — DX DG CHEST 1V PORT
1 series · 1 of 1 positions shown · non-contrast
Comparison: None.

CLINICAL DATA: Central chest pain for 1 day, some shortness of
breath, smoking history

EXAM:
PORTABLE CHEST 1 VIEW

[chest ap]
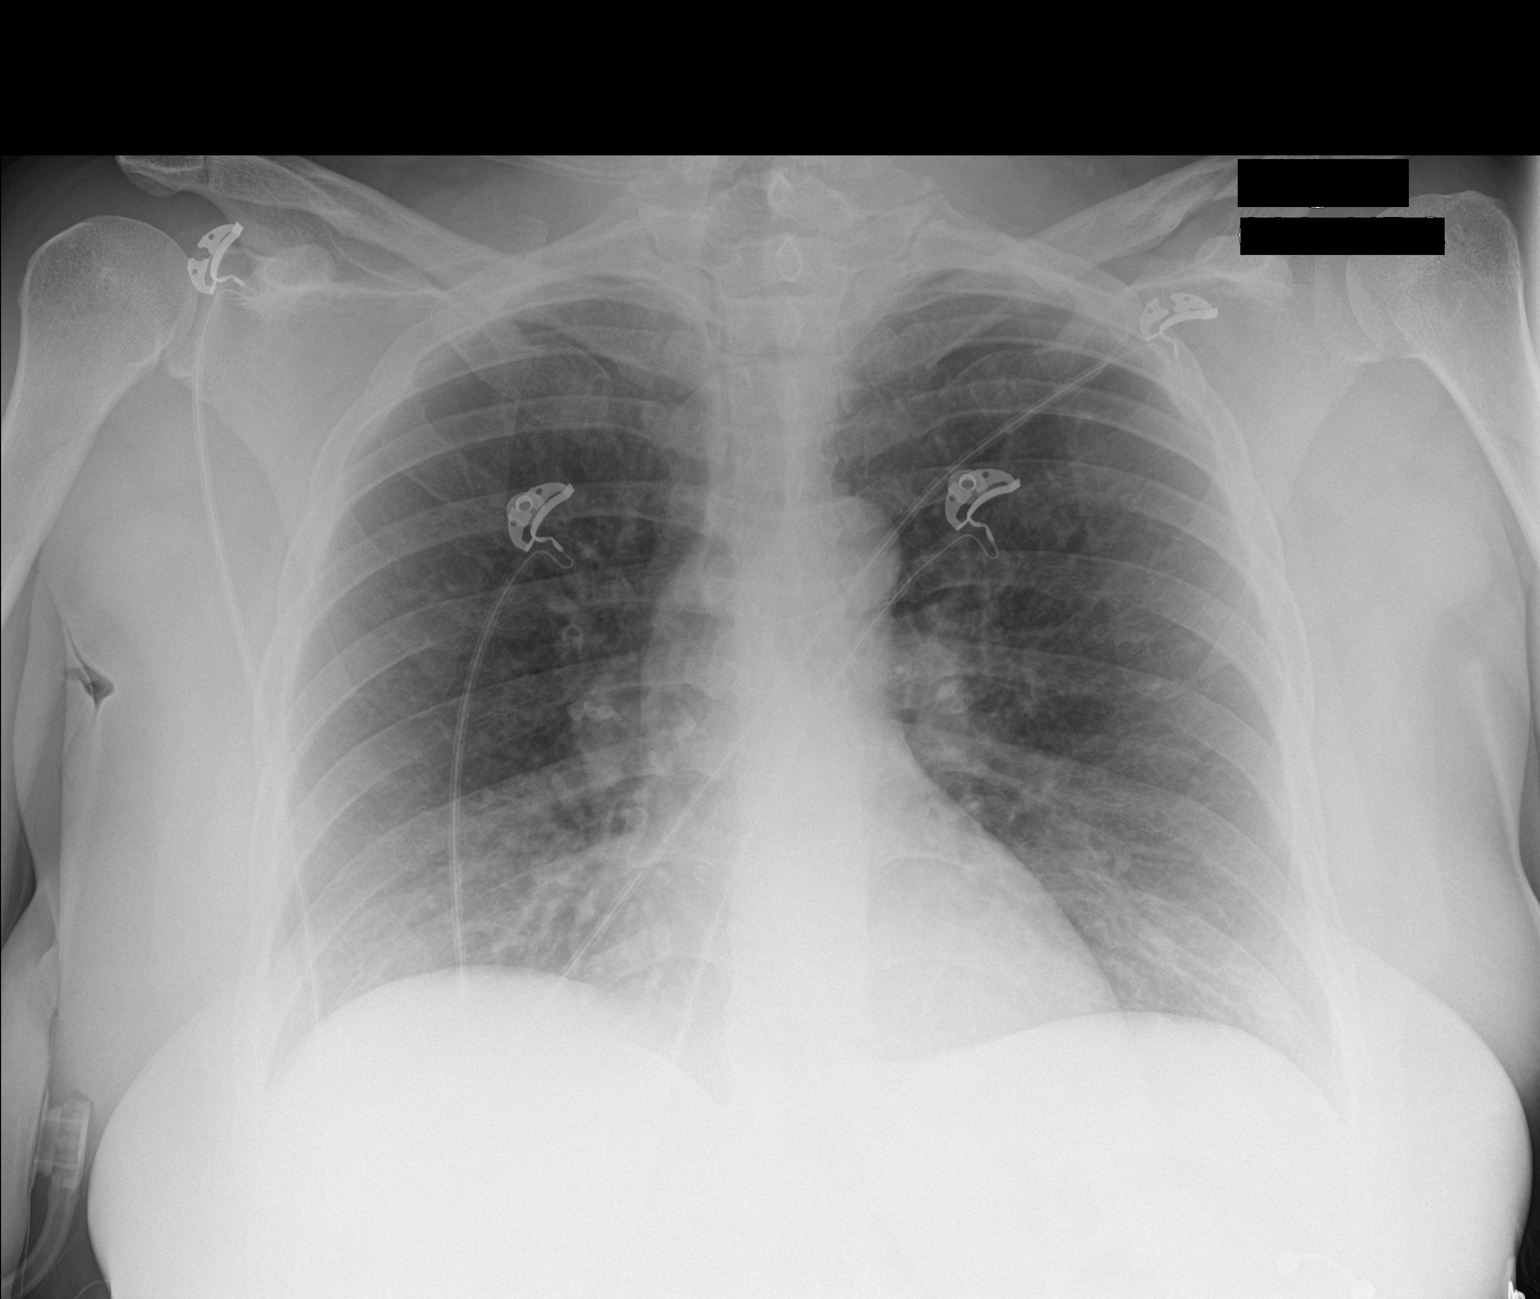

[1 of 1 positions shown; findings below may reference images not displayed]

FINDINGS: No active infiltrate or effusion is seen. Mediastinal and hilar
contours are unremarkable. The heart is within normal limits in
size. No bony abnormality is seen.
IMPRESSION: No active disease.

## 2018-08-25 IMAGING — CR DG CHEST 2V
1 series · 2 of 2 positions shown · non-contrast
Comparison: Chest radiograph 09/18/2016

CLINICAL DATA: Cough and shortness of breath.

EXAM:
CHEST  2 VIEW

[Series 1: dg chest 2 view · 0.14mm/px · 2 of 2 slices shown]
[im 1/2]
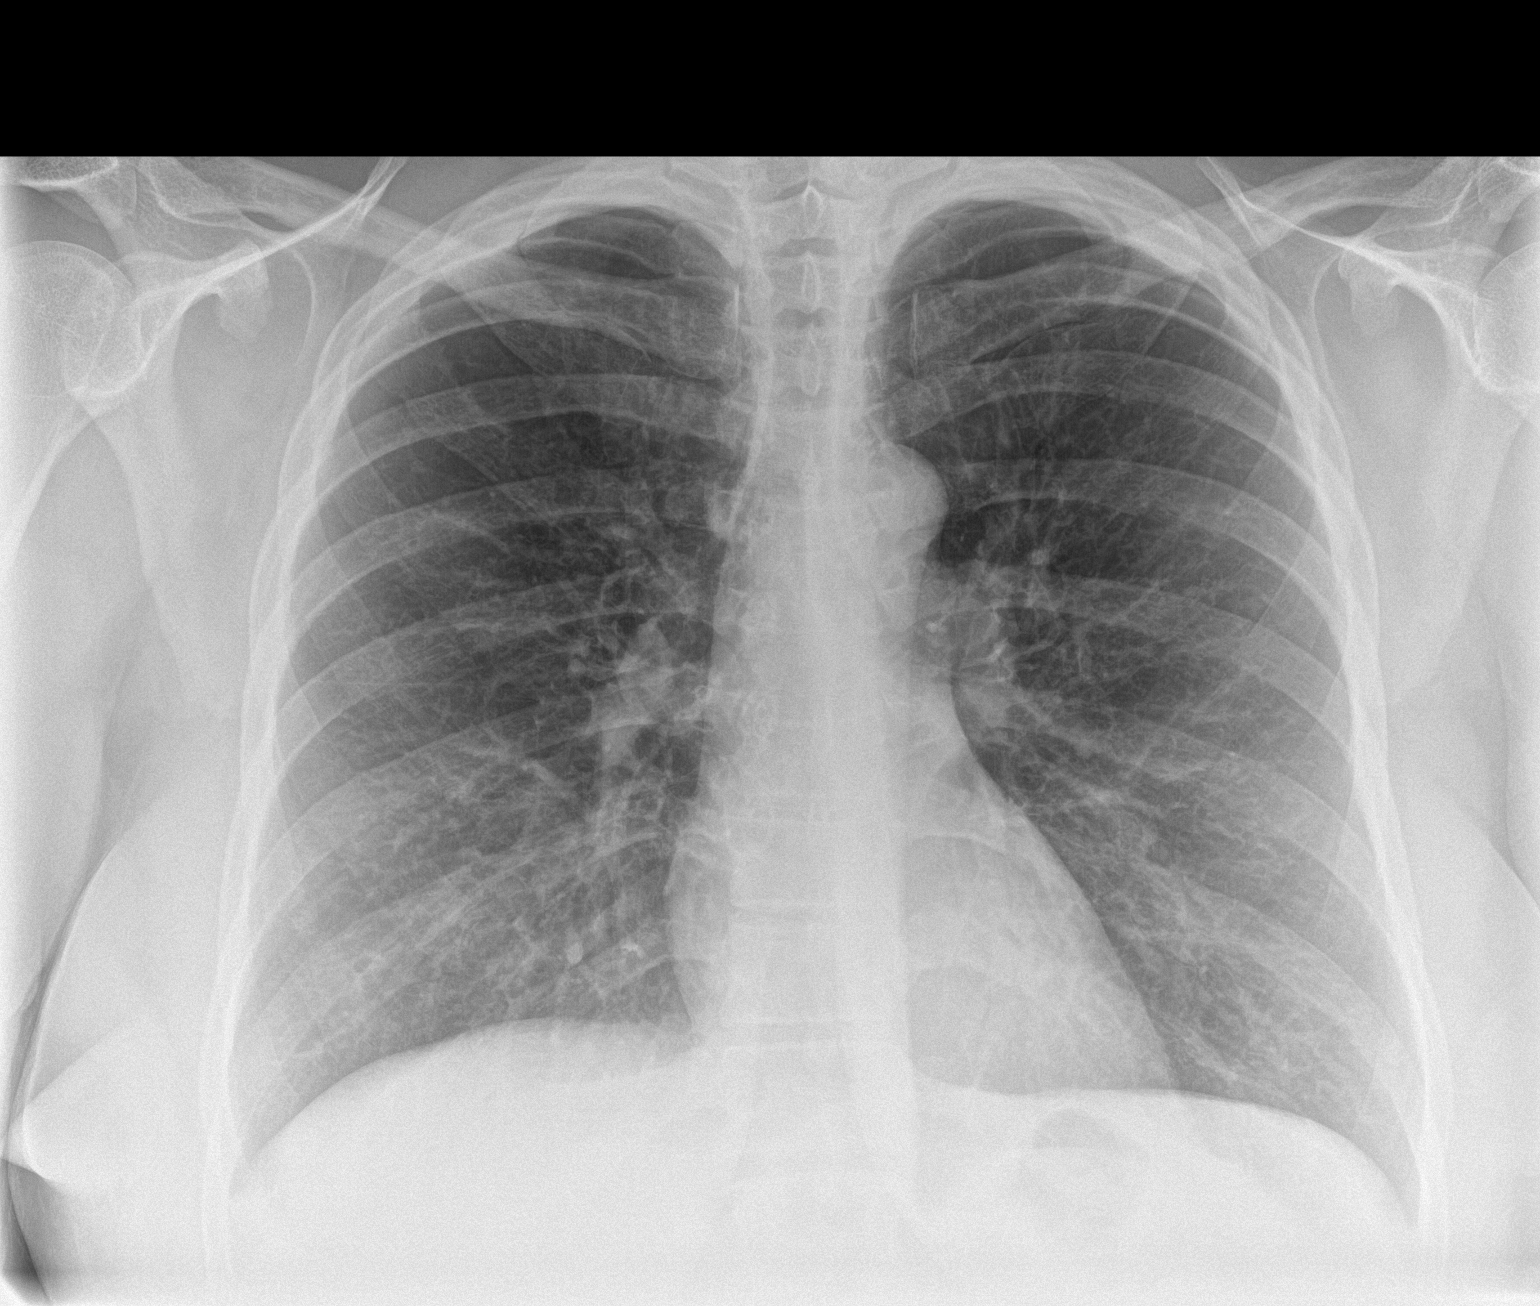
[im 2/2]
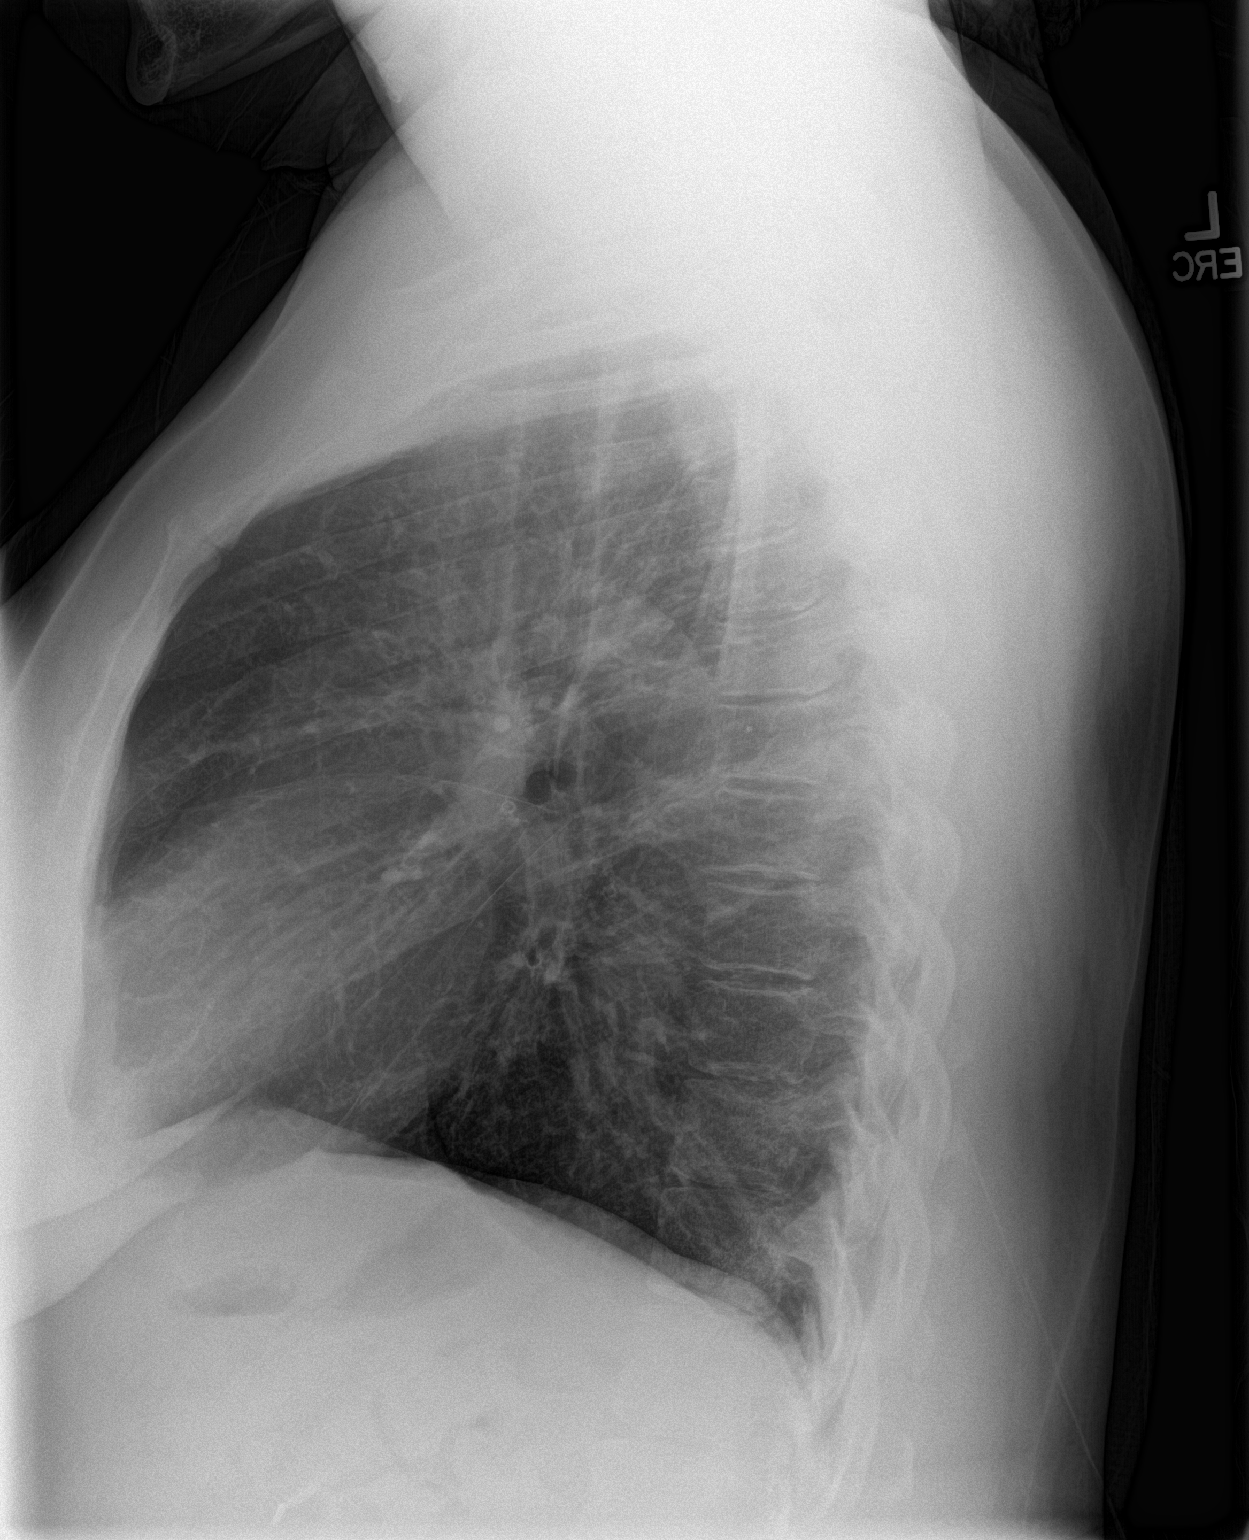

[2 of 2 positions shown; findings below may reference images not displayed]

FINDINGS: The cardiomediastinal contours are normal. Mild central bronchial
thickening. Pulmonary vasculature is normal. No consolidation,
pleural effusion, or pneumothorax. No acute osseous abnormalities
are seen.
IMPRESSION: Mild central bronchial thickening may be bronchitis or asthma.
